# Patient Record
Sex: Female | Born: 1965 | Race: White | Hispanic: No
Health system: Southern US, Academic
[De-identification: ages and names within clinical notes are randomized; demographics above are authoritative.]

## PROBLEM LIST (undated history)

## (undated) DIAGNOSIS — E8881 Metabolic syndrome: Secondary | ICD-10-CM

## (undated) DIAGNOSIS — I671 Cerebral aneurysm, nonruptured: Secondary | ICD-10-CM

## (undated) DIAGNOSIS — E559 Vitamin D deficiency, unspecified: Secondary | ICD-10-CM

## (undated) DIAGNOSIS — K219 Gastro-esophageal reflux disease without esophagitis: Secondary | ICD-10-CM

## (undated) DIAGNOSIS — E785 Hyperlipidemia, unspecified: Secondary | ICD-10-CM

## (undated) HISTORY — DX: Hyperlipidemia, unspecified: E78.5

## (undated) HISTORY — DX: Metabolic syndrome: E88.810

## (undated) HISTORY — DX: Metabolic syndrome: E88.81

## (undated) HISTORY — DX: Cerebral aneurysm, nonruptured: I67.1

## (undated) HISTORY — DX: Gastro-esophageal reflux disease without esophagitis: K21.9

## (undated) HISTORY — DX: Vitamin D deficiency, unspecified: E55.9

---

## 1992-07-04 ENCOUNTER — Other Ambulatory Visit (HOSPITAL_COMMUNITY): Payer: Self-pay

## 2019-07-21 IMAGING — MG 3D SCREENING MAMMO BIL W/CAD
5 series · 8 of 24 positions shown · non-contrast
Comparison: 10/29/2016 and 01/10/2015.

------------- REPORT [id number] -------------
[facility]
[address]
Daina Ms.HASUDA, KYOUJI:
We wish to report the following on your recent mammography examination. We are sending a report to your referring physician or other health care provider. 
(       Normal/Negative:
No evidence of cancer.
This statement is mandated by the [place], Department of Health.
Your examination was performed by one of our technologists, who are registered radiological technologists and also specially certified in mammography:
___
Parlak, Edaly (M)
Nepomuceno, Martinez (M)

Your mammogram was interpreted by our radiologist.
( 
Sofeine Made, M.D.
(Annual Breast Examination by a physician or other health care provider
(Annual Mammography Screening beginning at age 40
(Monthly Breast Self Examination
------------- REPORT [id number] -------------
EXAM:  3D BILATERAL ANNUAL SCREENING DIGITAL MAMMOGRAM WITH TOMOSYNTHESIS AND CAD
INDICATION: Screening.

[R CC · right · 0.10mm/px · 2 of 2 slices shown]
[im 1/2]
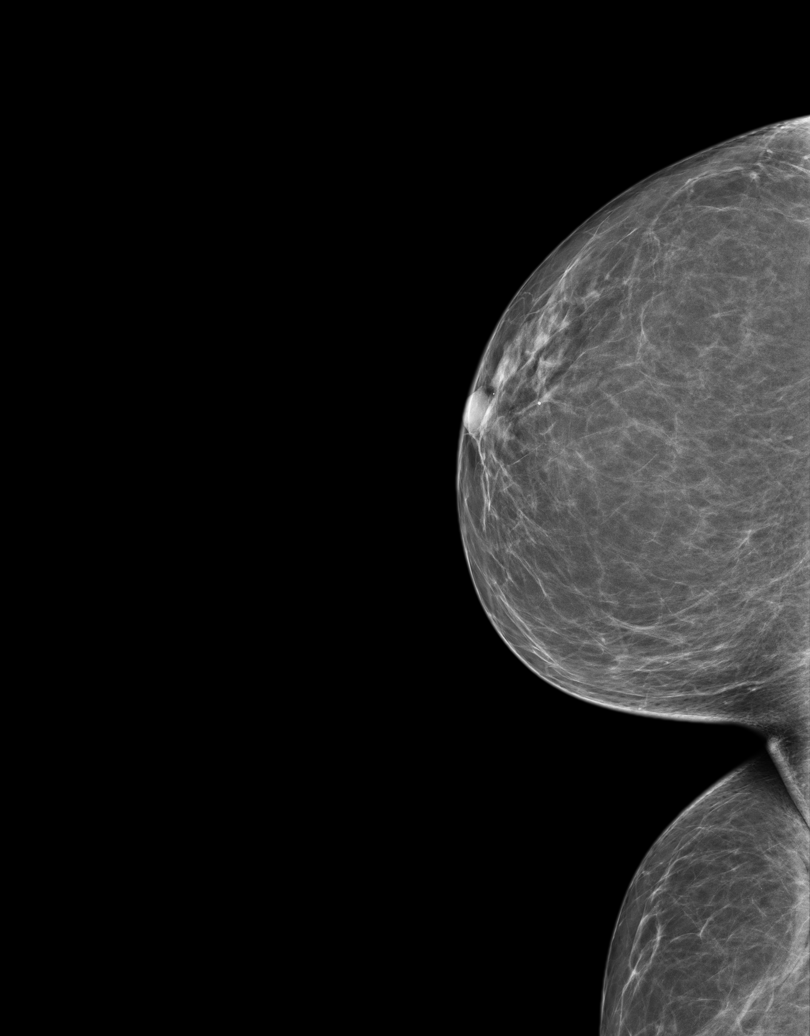
[im 2/2]
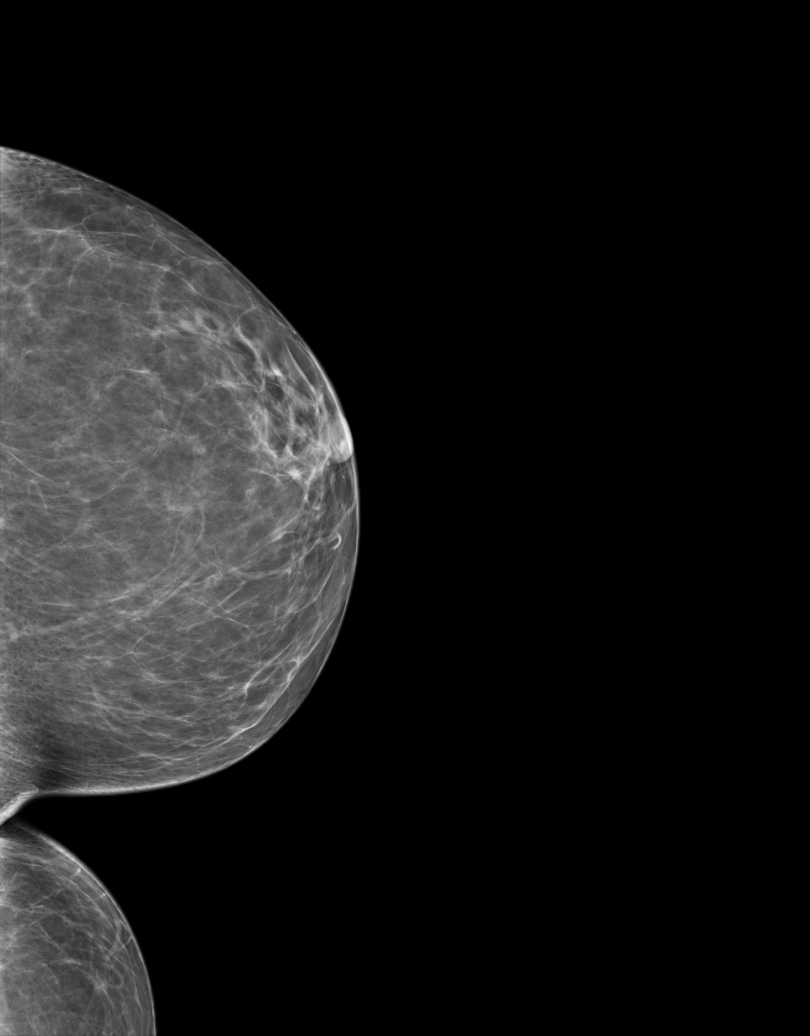

[3D SCREENING MAMMO BIL W/CAD · 2 acquisitions, 3 frames shown (1 of 2)]
[im 1/2]
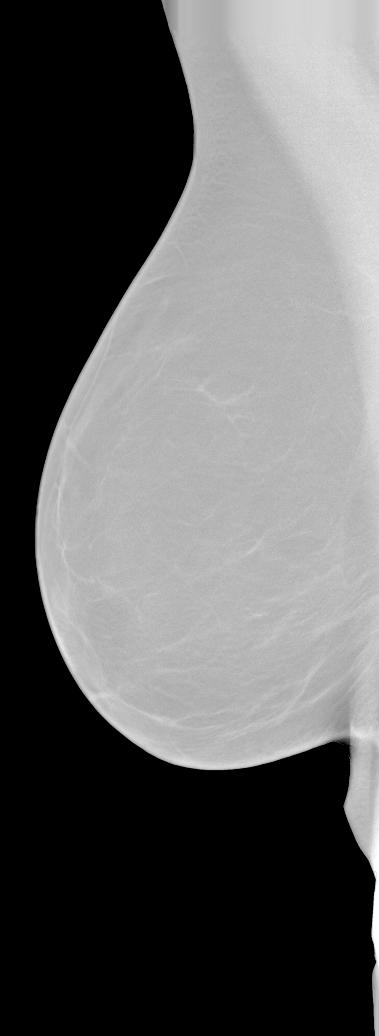
[im 2/2]
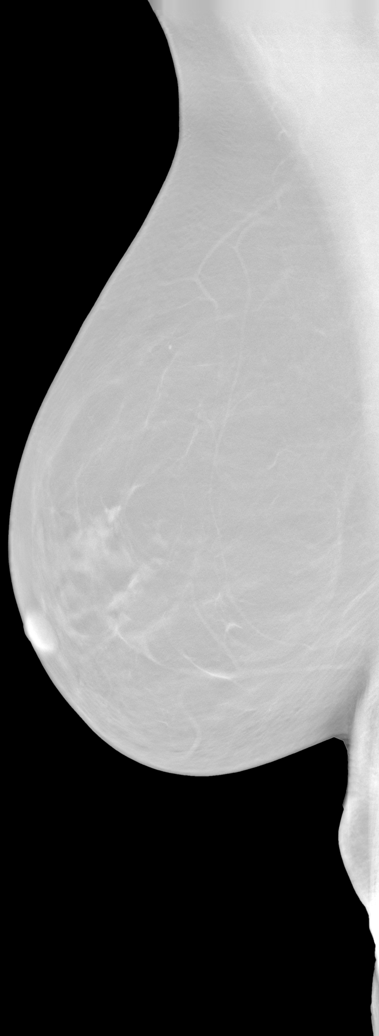
[im 2/2]
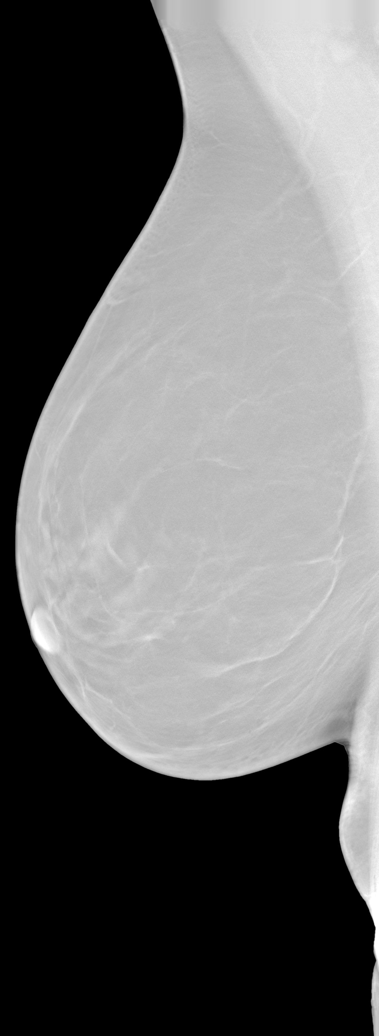

[R]
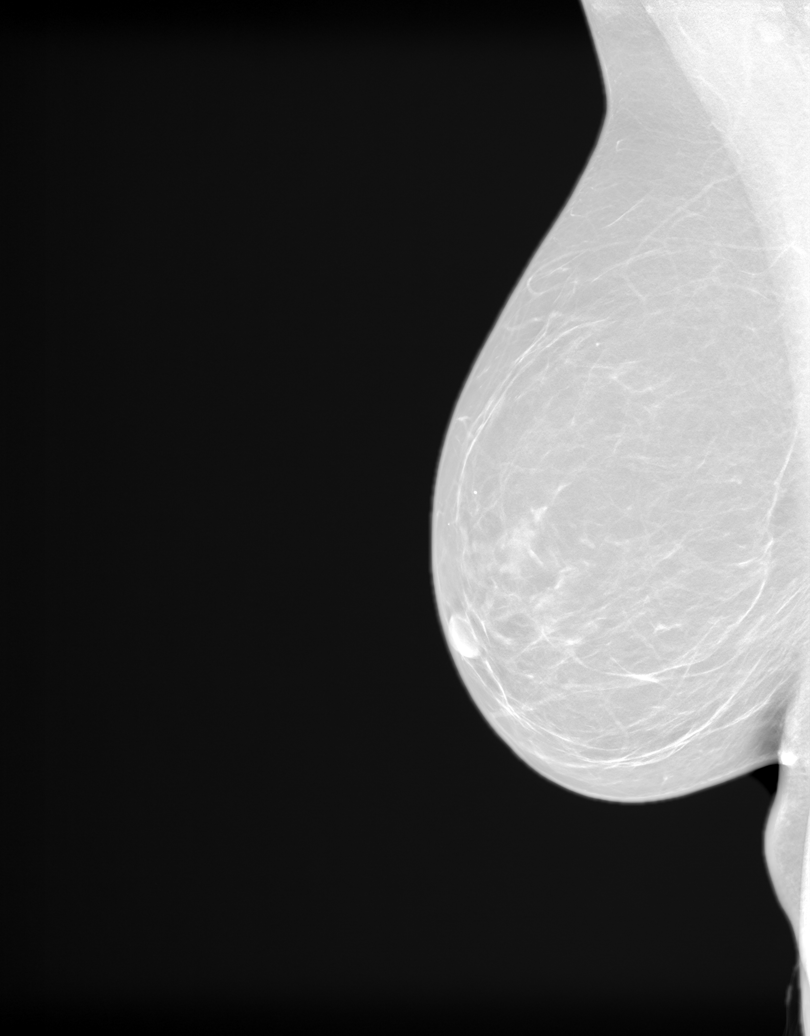

[3D SCREENING MAMMO BIL W/CAD (2 of 2) · tomo slice 11/69.0]
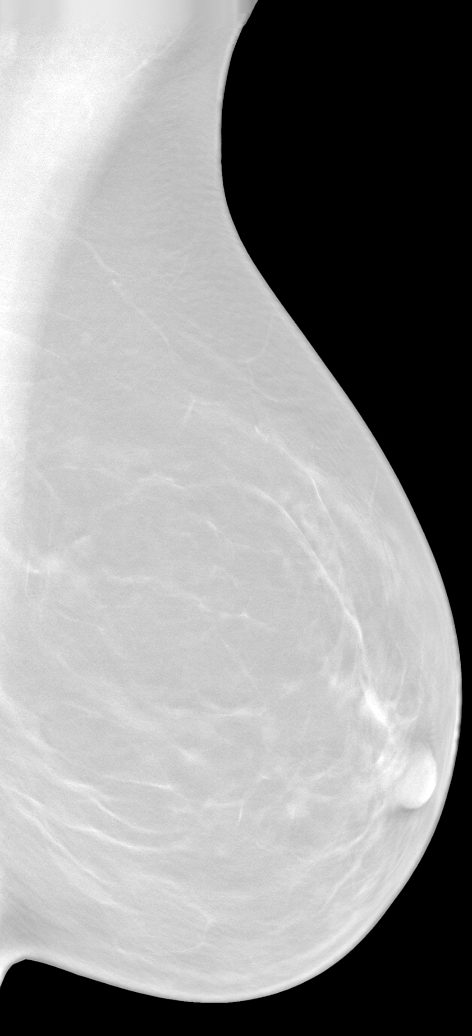

[L]
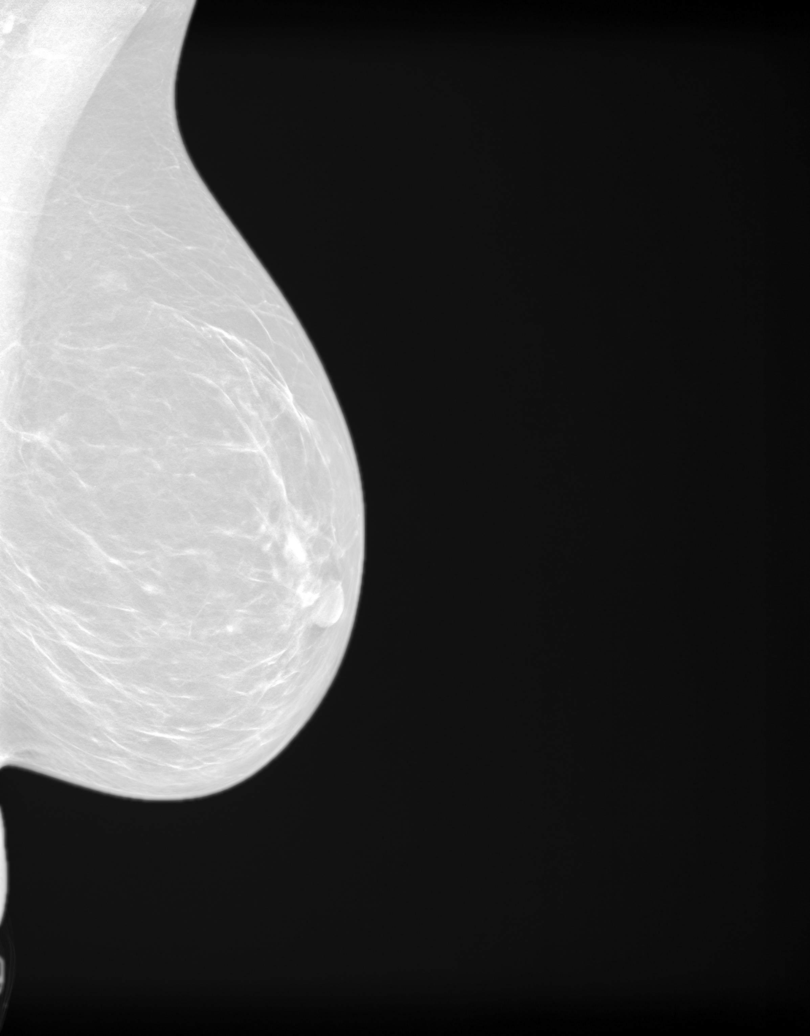

[8 of 24 positions shown; findings below may reference images not displayed]

FINDINGS: There are scattered fibroglandular elements.  There is no mass or suspicious cluster of microcalcifications.   There is no architectural distortion, skin thickening or nipple retraction.
IMPRESSION: 1.  BIRADS 2-Benign findings. Patient has been added in a reminder system with a target date for the next screening mammography.

2.  DENSITY CODE –  B (Scattered areas of fibroglandular density).

Final Assessment Code:

Bi-Rads 2 

BI-RADS 0
Need additional imaging evaluation

BI-RADS 1
Negative mammogram

BI-RADS 2
Benign finding

BI-RADS 3
Probably benign finding; short-interval follow-up suggested

BI-RADS 4
Suspicious abnormality; biopsy should be considered

BI-RADS 5
Highly suggestive of malignancy; appropriate action should be taken

BI-RADS 6
Known biopsy-proven malignancy; appropriate action should be taken

NOTE:
In compliance with Federal regulations, the results of this mammogram are being sent to the patient.

## 2020-08-17 IMAGING — MG 3D SCREENING MAMMO BIL W/CAD & TOMO
5 series · 7 of 24 positions shown · non-contrast
Comparison: 11/14/2020

------------- REPORT [id number] -------------
[facility] [place]
[address]
Tiger Ms.FORDHAM, GIORGIO:
We wish to report the following on your recent mammography examination. We are sending a report to your referring physician or other health care provider. 
(       Normal/Negative:
No evidence of cancer.
This statement is mandated by the [place], Department of Health.
Your examination was performed by one of our technologists, who are registered radiological technologists and also specially certified in mammography:
___
Markland, Marjuan (M)

Your mammogram was interpreted by our radiologist.
( 
Collette Sedman, M.D.
(Annual Breast Examination by a physician or other health care provider
(Annual Mammography Screening beginning at age 40
(Monthly Breast Self Examination
------------- REPORT [id number] -------------
﻿
                         #:
EXAM:  3D BILATERAL ANNUAL SCREENING DIGITAL MAMMOGRAM WITH CAD AND TOMOSYNTHESIS
INDICATION: Screening.

[R]
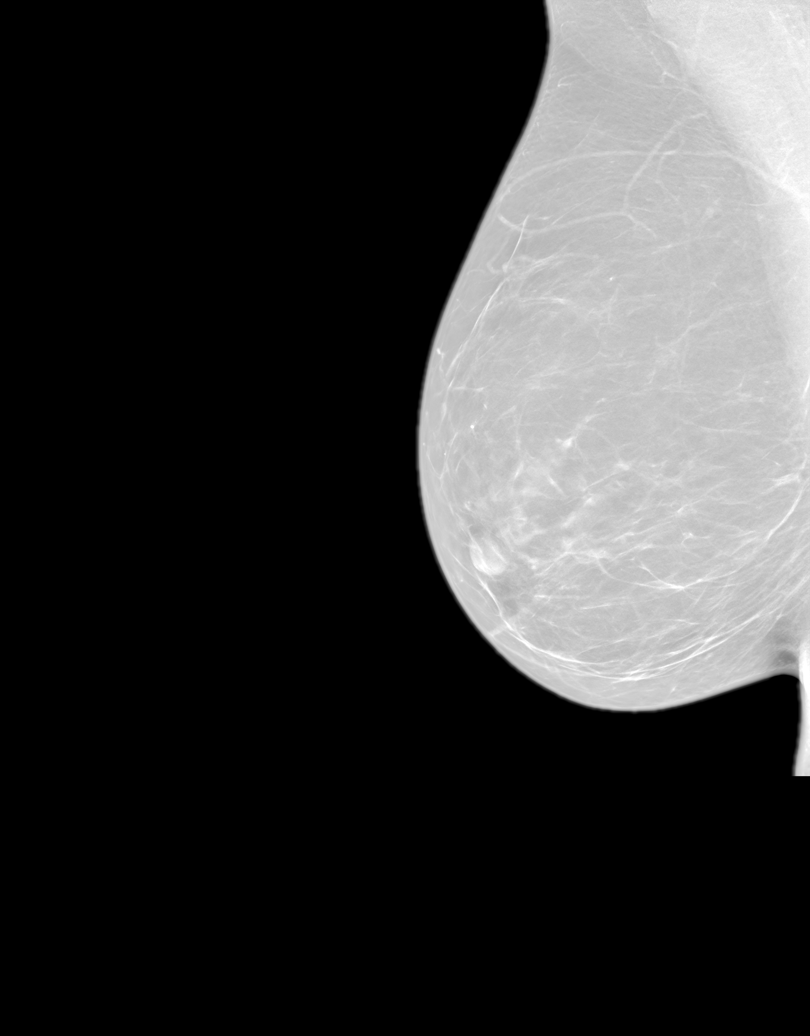

[L]
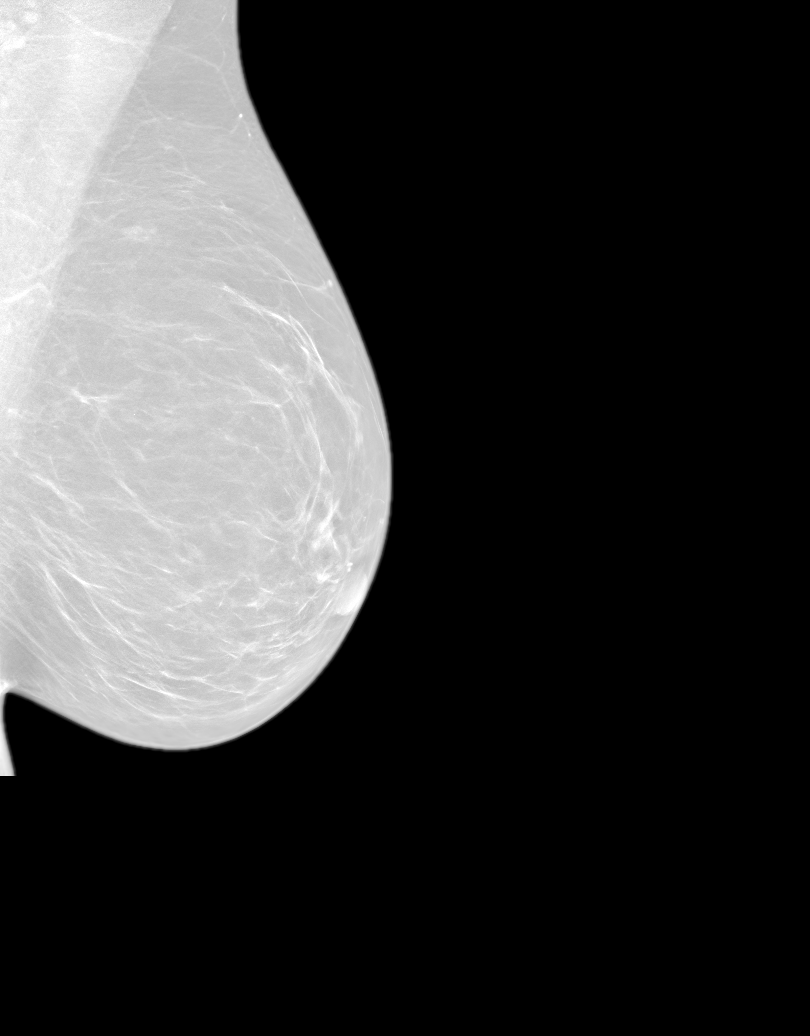

[Series 2749: R CC tomo · right · 2 of 3 slices shown]
[im 1/3]
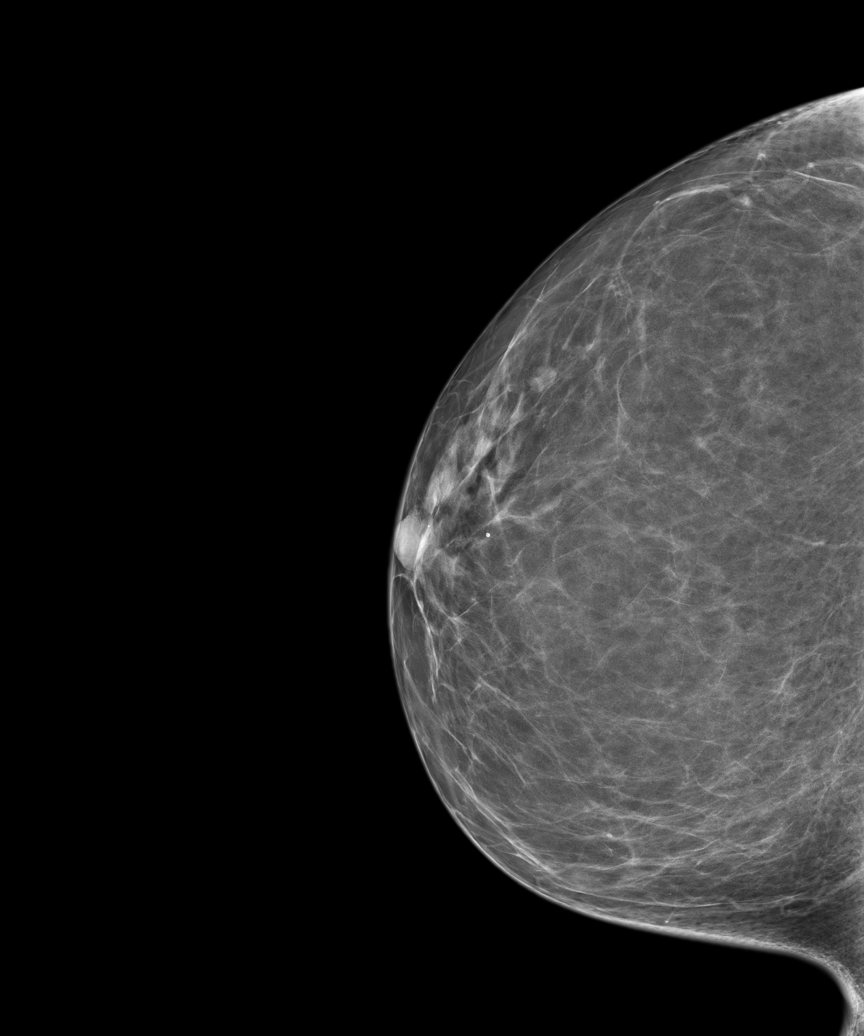
[im 3/3]
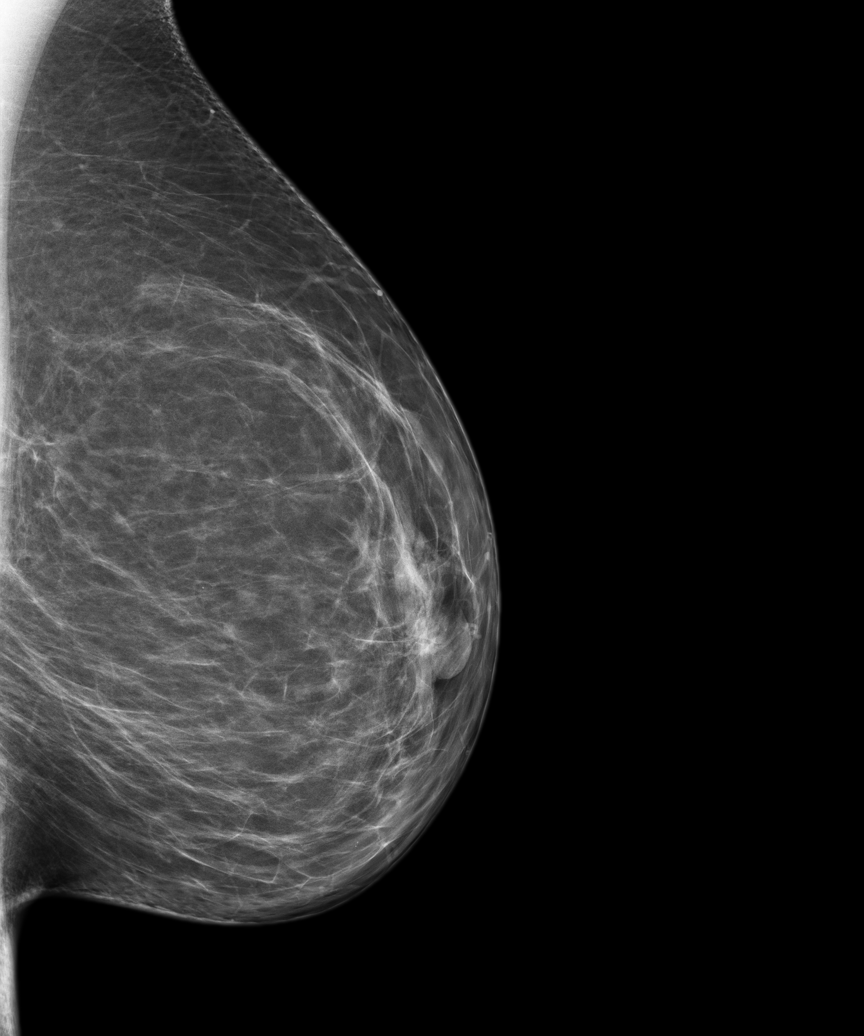

[Series 2751: 3D SCREENING MAMMO BIL W/CAD & TOMO tomo · 2 acquisitions, 2 frames shown (1 of 2)]
[im 1/2]
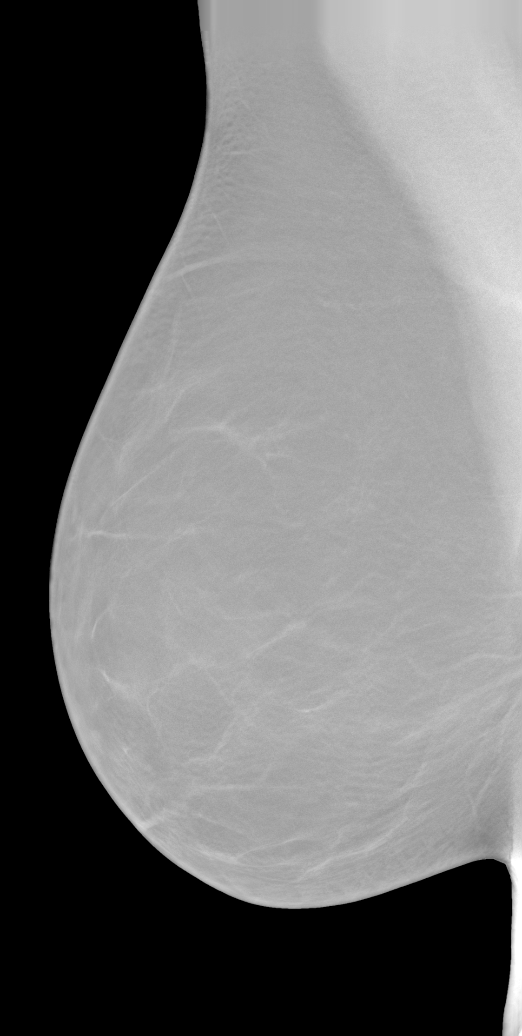
[im 2/2]
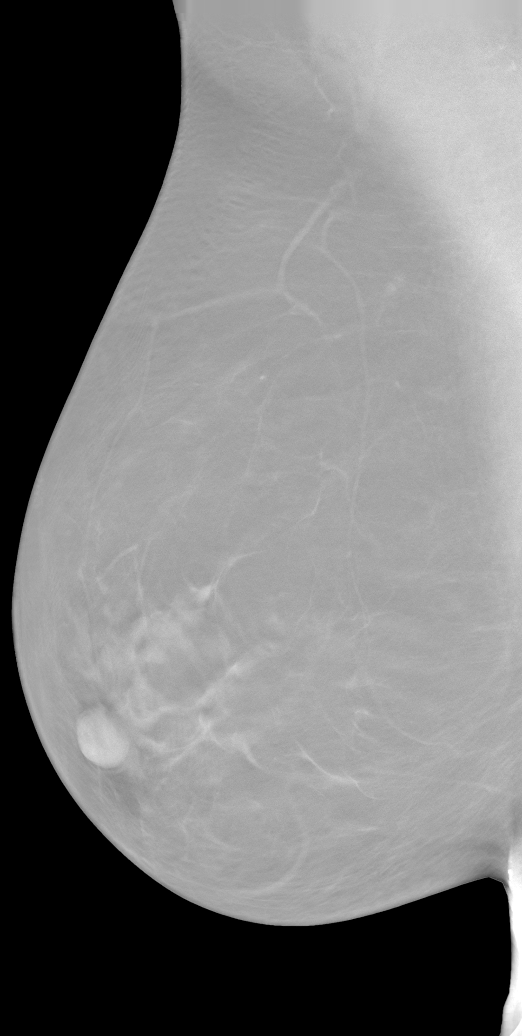

[3D SCREENING MAMMO BIL W/CAD & TOMO tomo (2 of 2) · tomo slice 13/79.0]
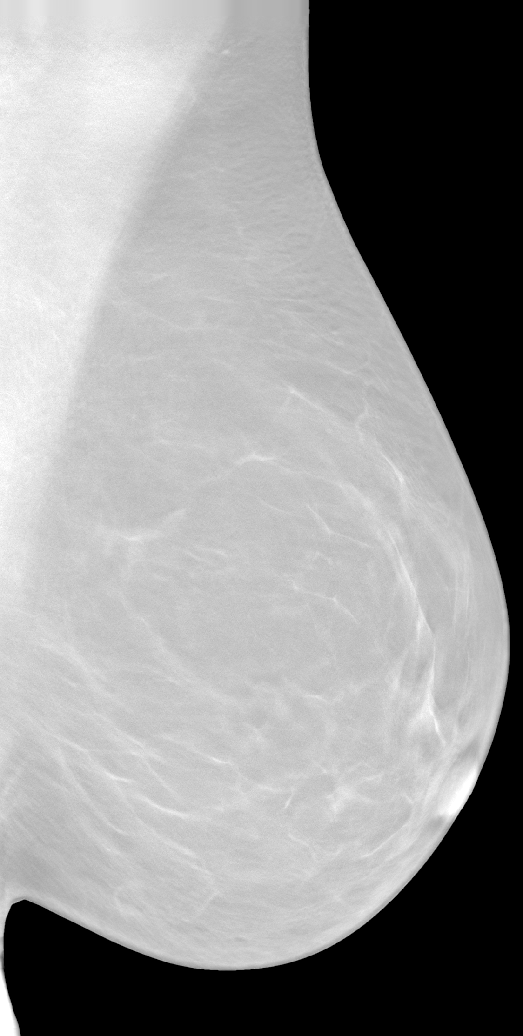

[7 of 24 positions shown; findings below may reference images not displayed]

FINDINGS: There are scattered fibroglandular elements.  There is no mass or suspicious cluster of microcalcifications.   There is no architectural distortion, skin thickening or nipple retraction.
IMPRESSION: 1.  BIRADS 2-Benign findings. Patient has been added in a reminder system with a target date for the next screening mammography.

2.  DENSITY CODE – B (Scattered areas of fibroglandular density). 

Final Assessment Code:

Bi-Rads 2 

BI-RADS 0
Need additional imaging evaluation.

BI-RADS 1
Negative mammogram.

BI-RADS 2
Benign finding.

BI-RADS 3
Probably benign finding; short-interval follow-up suggested.

BI-RADS 4
Suspicious abnormality; biopsy should be considered.

BI-RADS 5
Highly suggestive of malignancy; appropriate action should be taken.

BI-RADS 6
Known biopsy-proven malignancy; appropriate action should be taken.

NOTE:
In compliance with Federal regulations, the results of this mammogram are being sent to the patient.

## 2021-11-02 ENCOUNTER — Encounter (INDEPENDENT_AMBULATORY_CARE_PROVIDER_SITE_OTHER): Payer: Self-pay | Admitting: Surgery

## 2021-11-02 NOTE — Nursing Note (Signed)
Spoke with Chyrl Civatte at referring providers office and made them aware of pt appointment with Dr. Abbe Amsterdam.

## 2021-11-08 ENCOUNTER — Encounter (INDEPENDENT_AMBULATORY_CARE_PROVIDER_SITE_OTHER): Payer: Self-pay | Admitting: Surgery

## 2021-11-15 ENCOUNTER — Ambulatory Visit (INDEPENDENT_AMBULATORY_CARE_PROVIDER_SITE_OTHER): Payer: Self-pay | Admitting: Surgery

## 2021-11-20 IMAGING — MG 3D SCREENING MAMMO BIL AND TOMO
5 series · 7 of 24 positions shown · non-contrast
Comparison: 08/28/2021 and 07/31/2020.

------------- REPORT [id number] -------------
[facility]
[address]
Tiger Ms.FORDHAM, GIORGIO:
We wish to report the following on your recent mammography examination. We are sending a report to your referring physician or other health care provider. 
(       Normal/Negative:
No evidence of cancer.
This statement is mandated by the [place], Department of Health.
Your examination was performed by one of our technologists, who are registered radiological technologists and also specially certified in mammography:
___
Markland, Marjuan (M)

Your mammogram was interpreted by our radiologist.
( 
Collette Sedman, M.D.
(Annual Breast Examination by a physician or other health care provider
(Annual Mammography Screening beginning at age 40
(Monthly Breast Self Examination
------------- REPORT [id number] -------------
﻿
EXAM:  3D BILATERAL ANNUAL SCREENING DIGITAL MAMMOGRAM WITH TOMOSYNTHESIS
INDICATION: Screening.

[R]
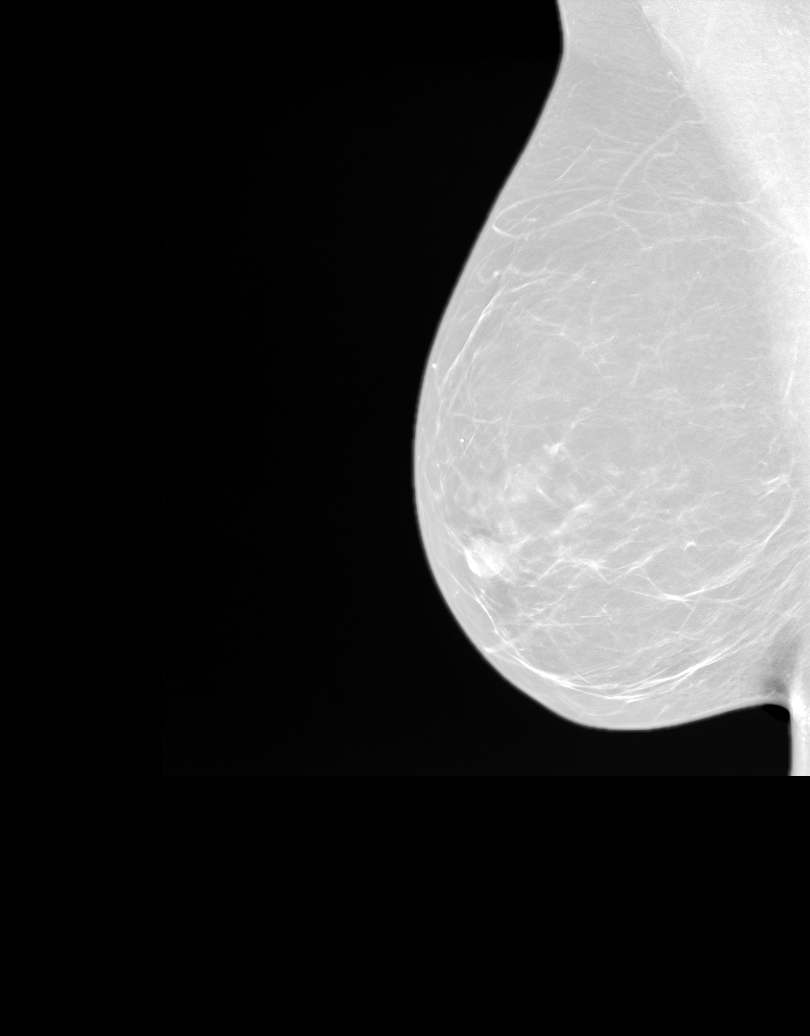

[L]
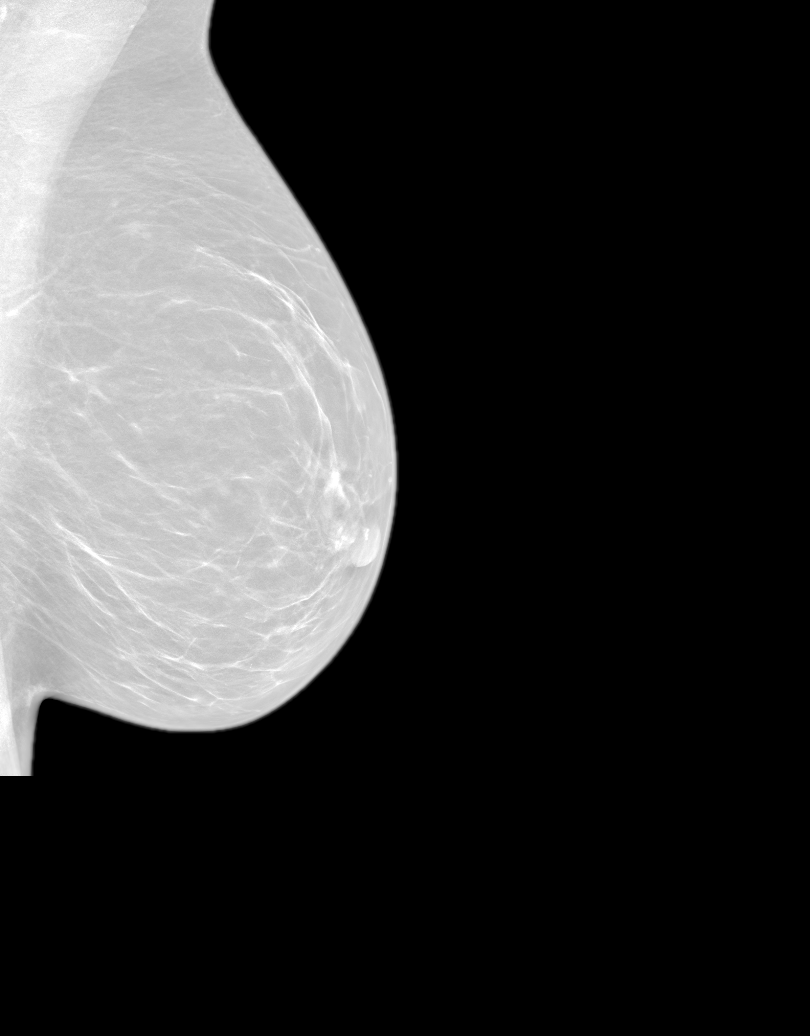

[R CC tomo · right · 0.10mm/px · 2 of 2 slices shown]
[im 1/2]
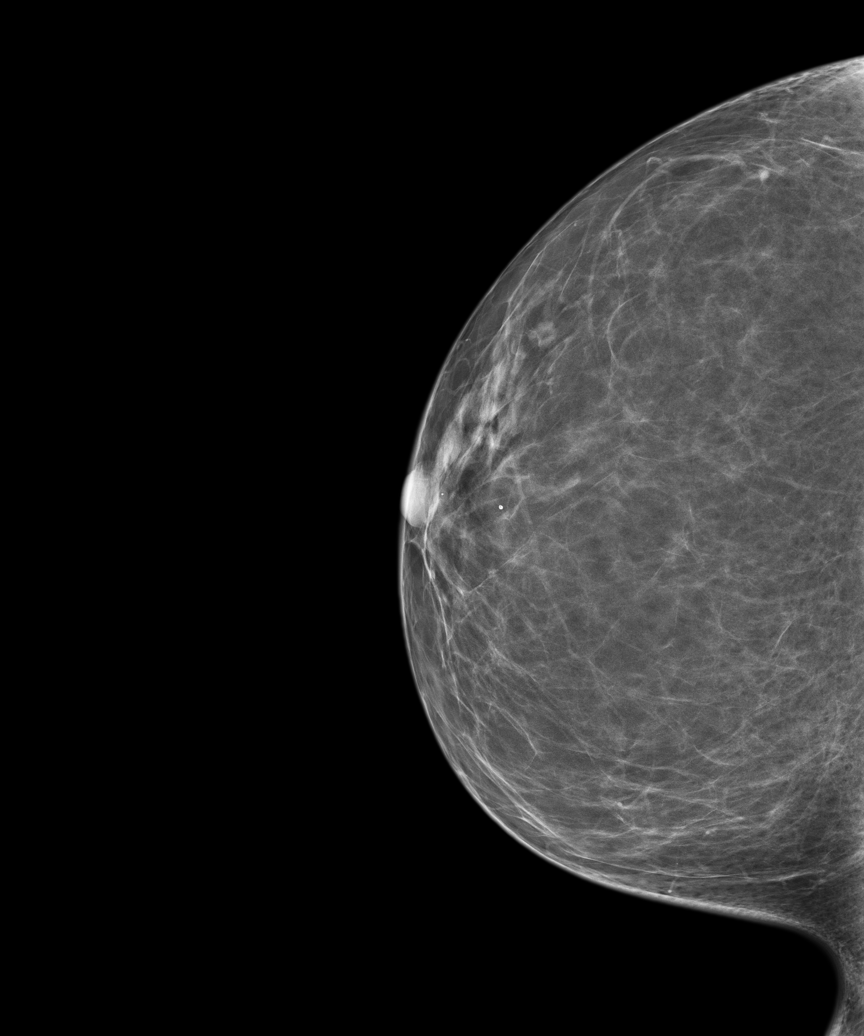
[im 2/2]
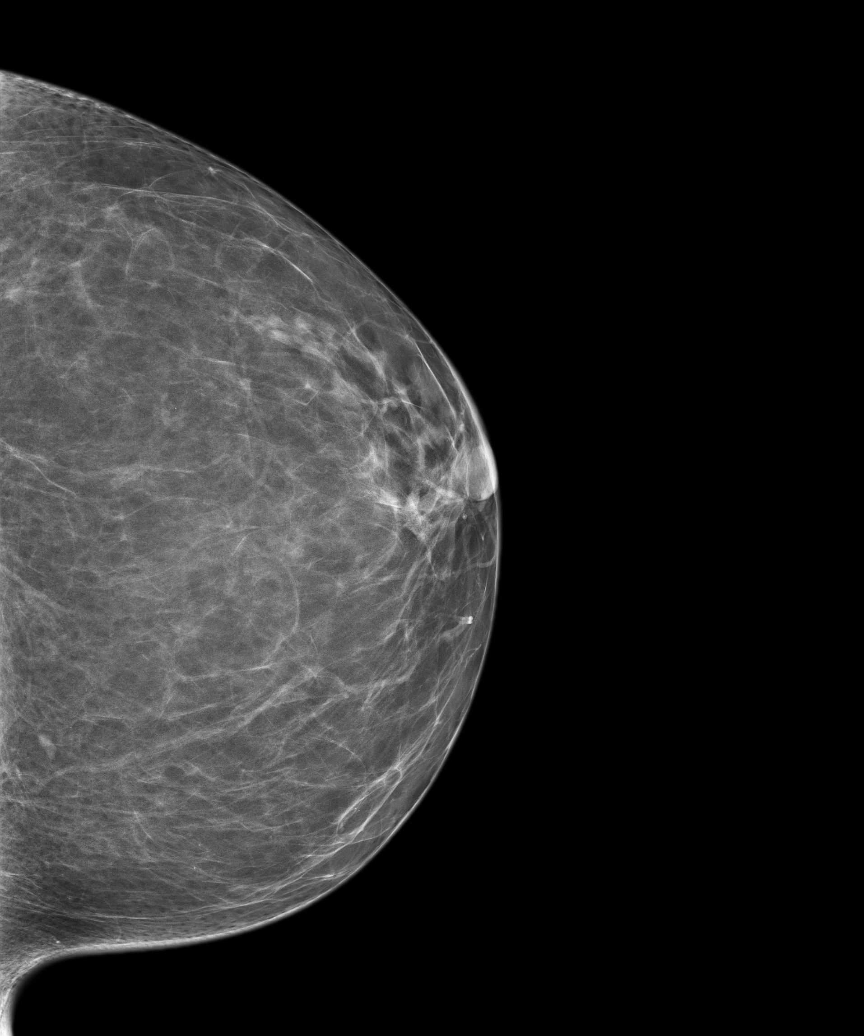

[3D SCREENING MAMMO BIL AND TOMO tomo · 2 acquisitions, 2 frames shown (1 of 2)]
[im 1/2]
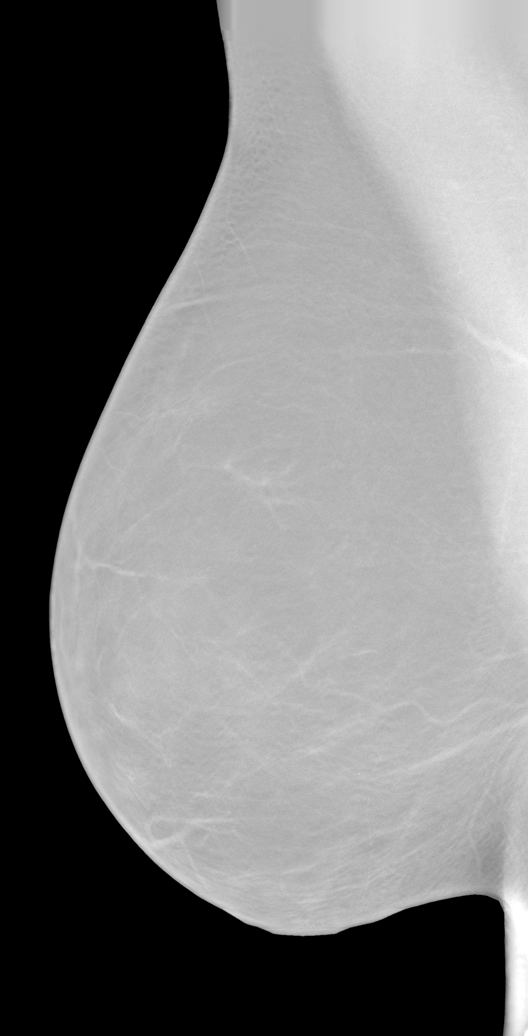
[im 2/2]
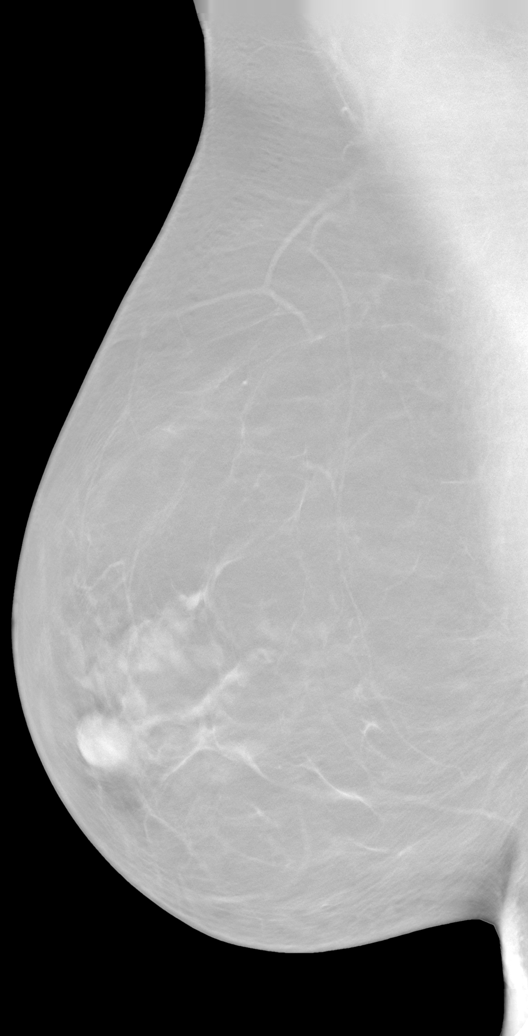

[3D SCREENING MAMMO BIL AND TOMO tomo (2 of 2) · tomo slice 12/74.0]
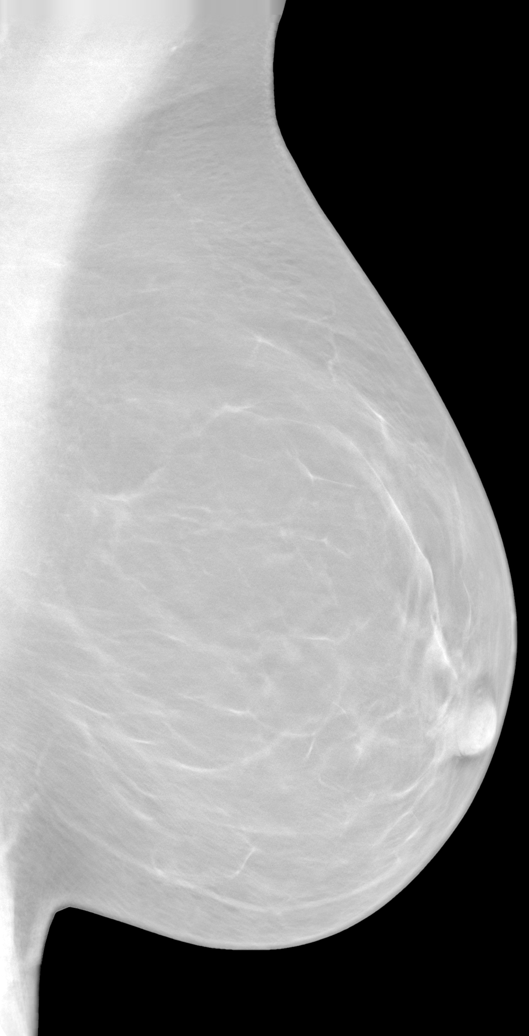

[7 of 24 positions shown; findings below may reference images not displayed]

FINDINGS: There are scattered fibroglandular elements.  There is no mass or suspicious cluster of microcalcifications.   There is no architectural distortion, skin thickening or nipple retraction.
IMPRESSION: 1.  BIRADS 2-Benign findings. Patient has been added in a reminder system with a target date for the next screening mammography.

2.  DENSITY CODE – B (Scattered areas of fibroglandular density). 

Final Assessment Code:

Bi-Rads 2 

BI-RADS 0
 Need additional imaging evaluation.

BI-RADS 1
 Negative mammogram.

BI-RADS 2
 Benign finding.

BI-RADS 3
 Probably benign finding; short-interval follow-up suggested.

BI-RADS 4
 Suspicious abnormality; biopsy should be considered.

BI-RADS 5
 Highly suggestive of malignancy; appropriate action should be taken.

BI-RADS 6
 Known biopsy-proven malignancy; appropriate action should be taken.

NOTE:
In compliance with Federal regulations, the results of this mammogram are being sent to the patient.

## 2021-11-22 ENCOUNTER — Ambulatory Visit (INDEPENDENT_AMBULATORY_CARE_PROVIDER_SITE_OTHER): Payer: No Typology Code available for payment source | Admitting: Surgery

## 2023-06-27 IMAGING — MR MRI HAND LT WO CONTRAST
4 of 8 series · 19 of 40 positions shown · IV contrast (gadolinium)
Comparison: Two views of left hand and thumb obtained today for comparison.

﻿EXAM:  36453   MRI HAND LT WO CONTRAST
INDICATION: 57-year-old sustained trauma on [DATE]th.  Persistent pain in the left thumb with a diagnosis of sprained radial collateral ligament of metacarpophalangeal joint.
TECHNIQUE: Multiplanar, multisequential MRI of the left hand including the thumb was performed without gadolinium contrast.

[Series 6: T1 · axial · left · 4.0mm · 0.31mm/px · z∈[-35,+81]mm · 3 of 38 slices shown]
[im 7/38]
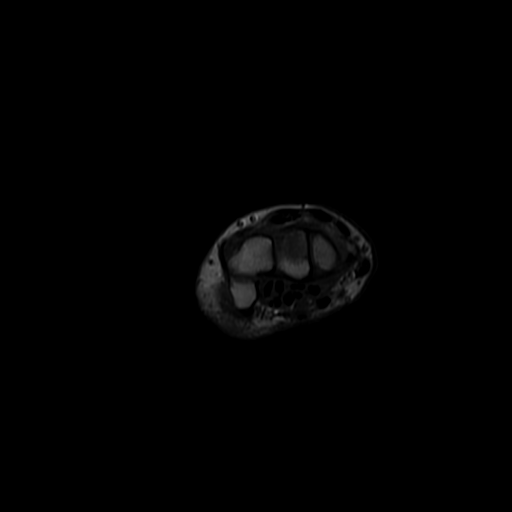
[im 19/38]
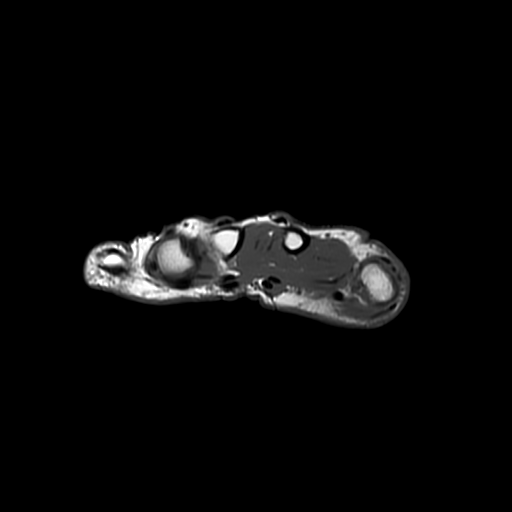
[im 31/38]
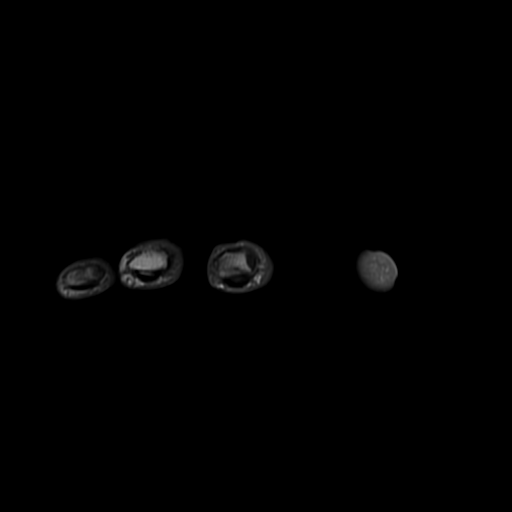

[Series 7: PD fat-sat · axial · left · 4.0mm · 0.35mm/px · z∈[-44,+134]mm · 8 of 38 slices shown (1 of 2)]
[im 1/38]
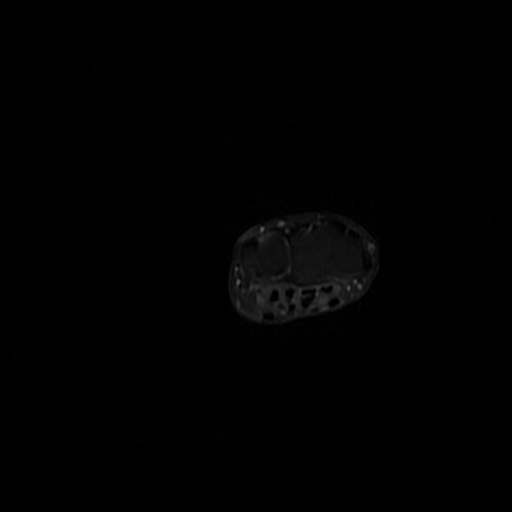
[im 6/38]
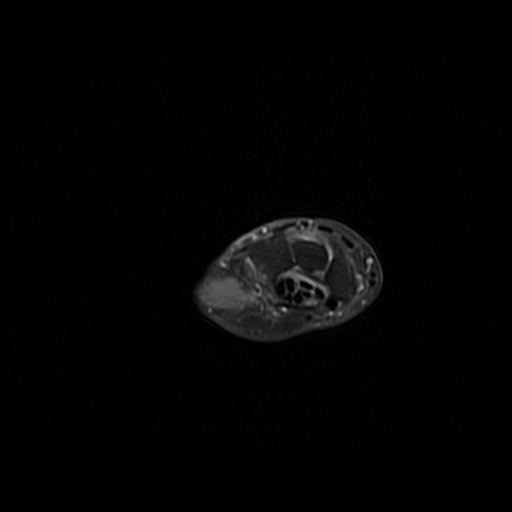
[im 11/38]
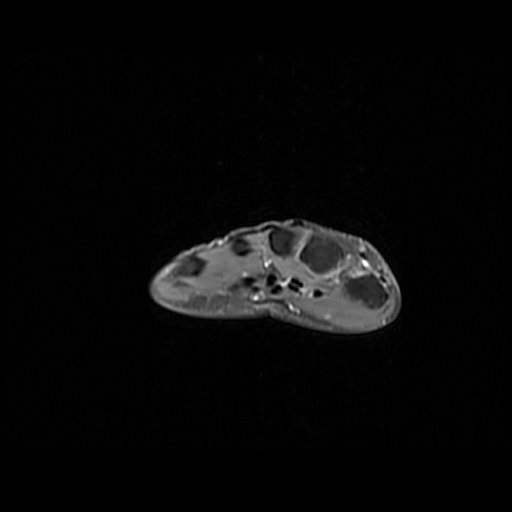
[im 16/38]
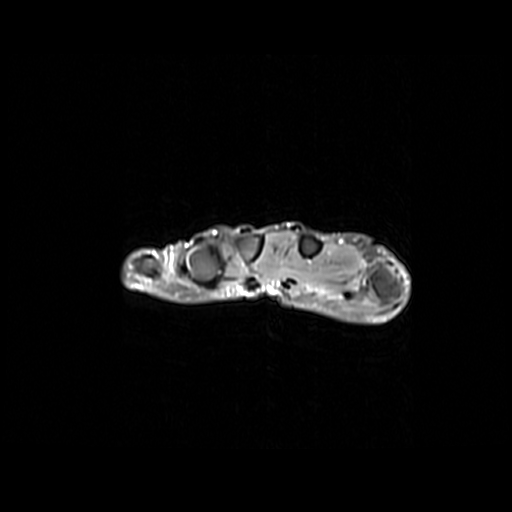
[im 22/38]
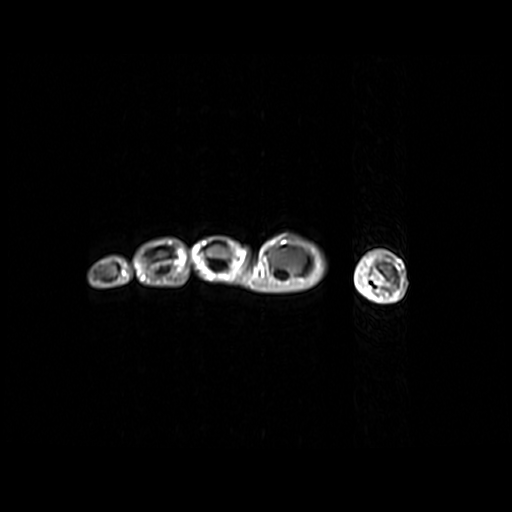
[im 27/38]
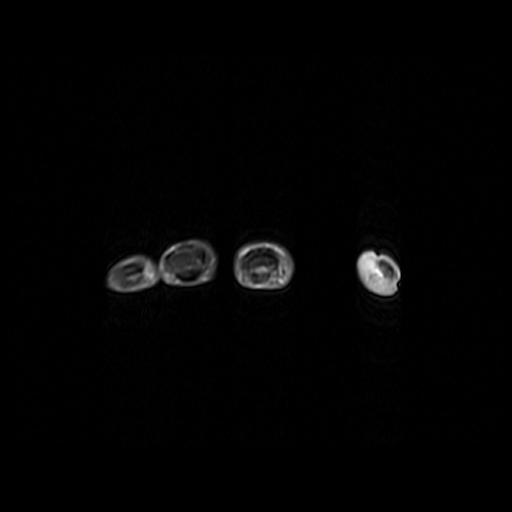
[im 32/38]
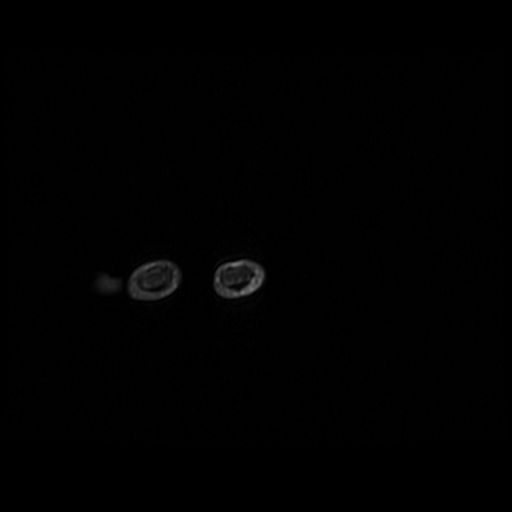
[im 38/38]
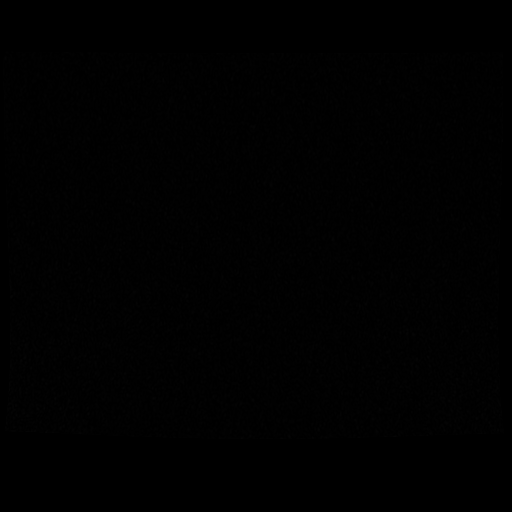

[Series 9: PD fat-sat · coronal · left · 2.5mm · 0.43mm/px · 4 of 18 slices shown (2 of 2)]
[im 1/18]
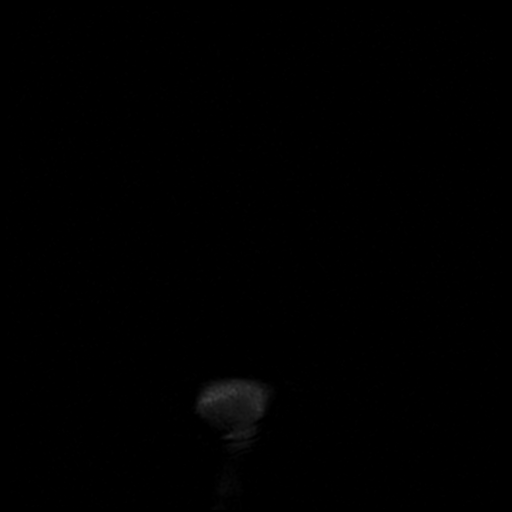
[im 6/18]
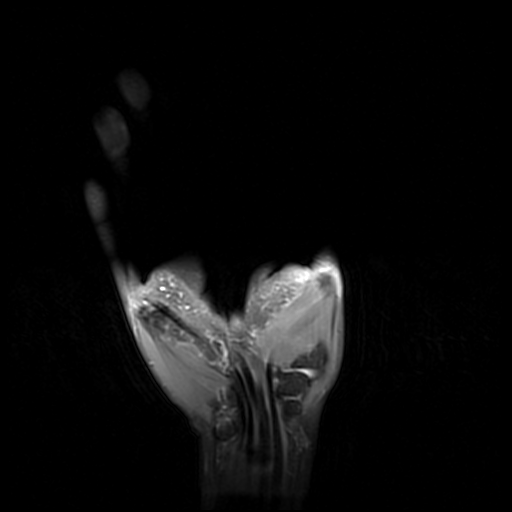
[im 12/18]
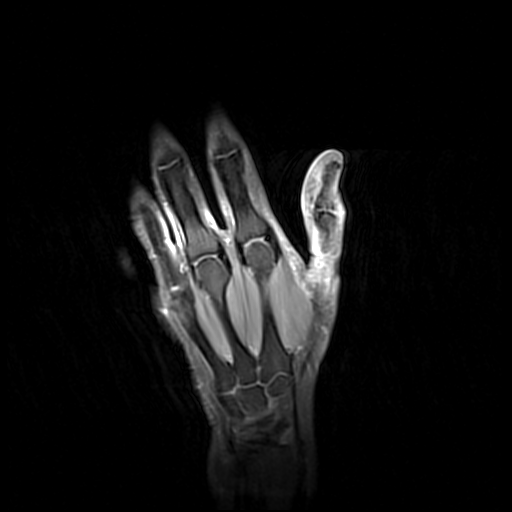
[im 18/18]
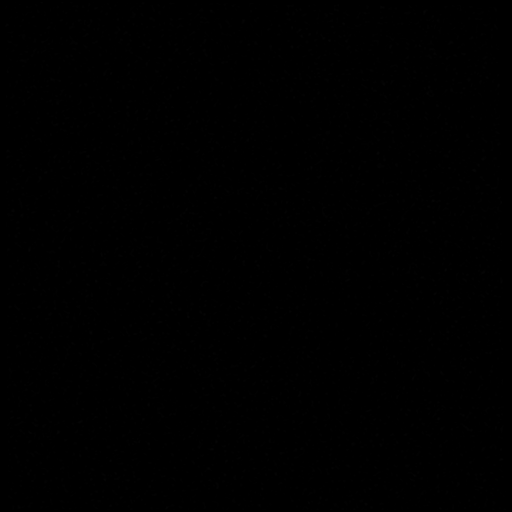

[Series 11: T2 fat-sat · sagittal · left · 3.0mm · 0.39mm/px · 4 of 30 slices shown]
[im 1/30]
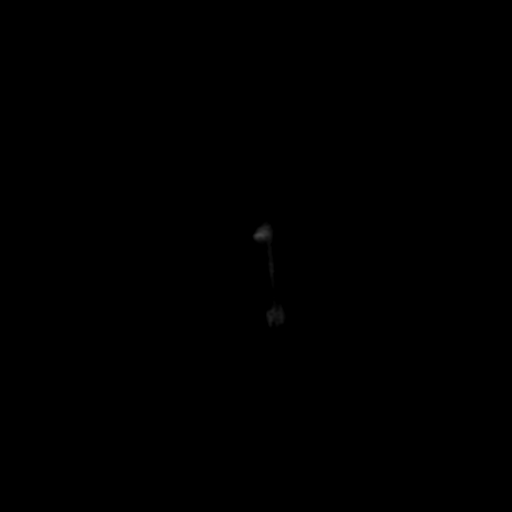
[im 6/30]
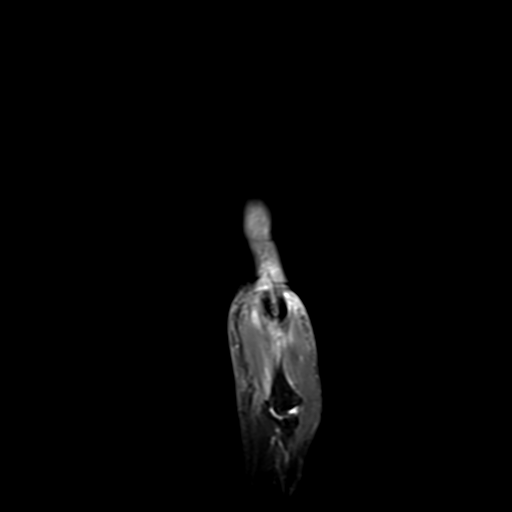
[im 18/30]
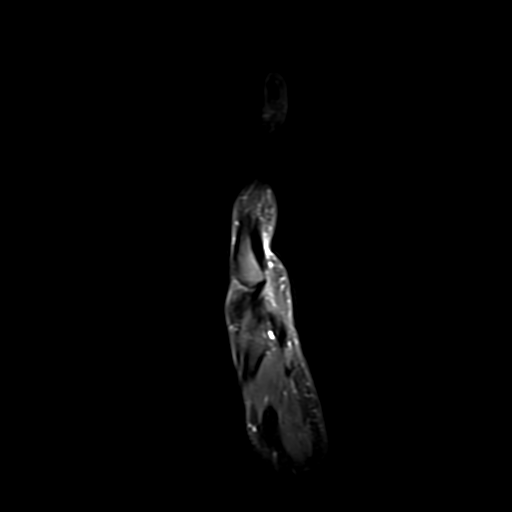
[im 30/30]
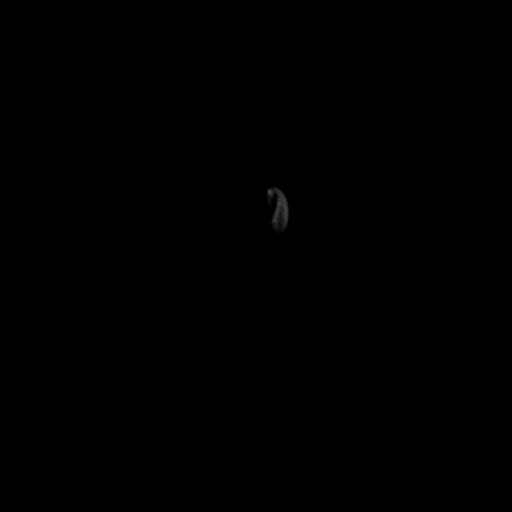

[19 of 40 positions shown; findings below may reference images not displayed]

FINDINGS: Bone marrow edema of the proximal and mid portion of the proximal phalanx of the thumb is noted.  No fracture line is seen.  Minimal bone marrow edema at the head of the 1st metacarpal bone is noted.  Interphalangeal joint of the left thumb is intact. 

Ulnar collateral ligament of the metacarpophalangeal joint of the thumb is intact.  Radial collateral ligament is not visualized clearly suggestive of sprained radial collateral ligament of the 1st metacarpophalangeal joint of the thumb.

The flexor and extensor tendons of the thumb are intact.  No acute findings at the wrist.
IMPRESSION: 1. No fracture line of the proximal phalanx of the thumb is seen. However, there is mild bone marrow edema of the proximal and midportion of the proximal phalanx of the thumb and minimal bone marrow edema at the head of the 1st metacarpal bone are noted.

2. The ulnar collateral ligament is intact. Radial collateral ligament is poorly visualized suggestive of sprained radial collateral ligament of the 1st metacarpophalangeal joint.  

Electronically Signed by FA, M MAHDI at [id number] [DATE]

## 2023-08-25 IMAGING — MG 3D SCREENING MAMMO BIL AND TOMO
3 series · 7 of 24 positions shown · non-contrast
Comparison: 11/22/2022, 08/19/2021.

------------- REPORT [id number] -------------
[facility]
[address]
[place]/MS/UNYIL, SUDARMONO
We wish to report the following on your recent mammography examination. We are sending a report to your referring physician or other health care provider.
INDICATION: Screening mammogram.  Asymptomatic 57-year-old with no family history.  Lifetime breast cancer risk 5.5%.

[R CC tomo · right · 0.10mm/px · 4 of 4 slices shown]
[im 1/4]
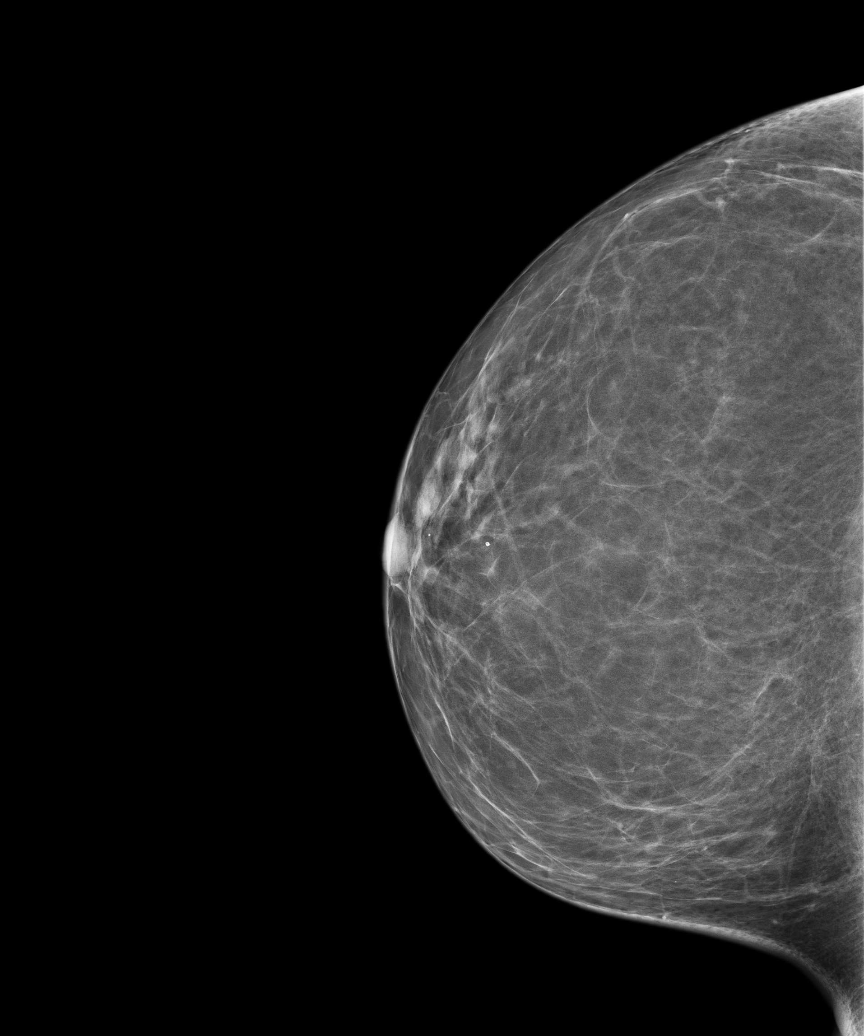
[im 2/4]
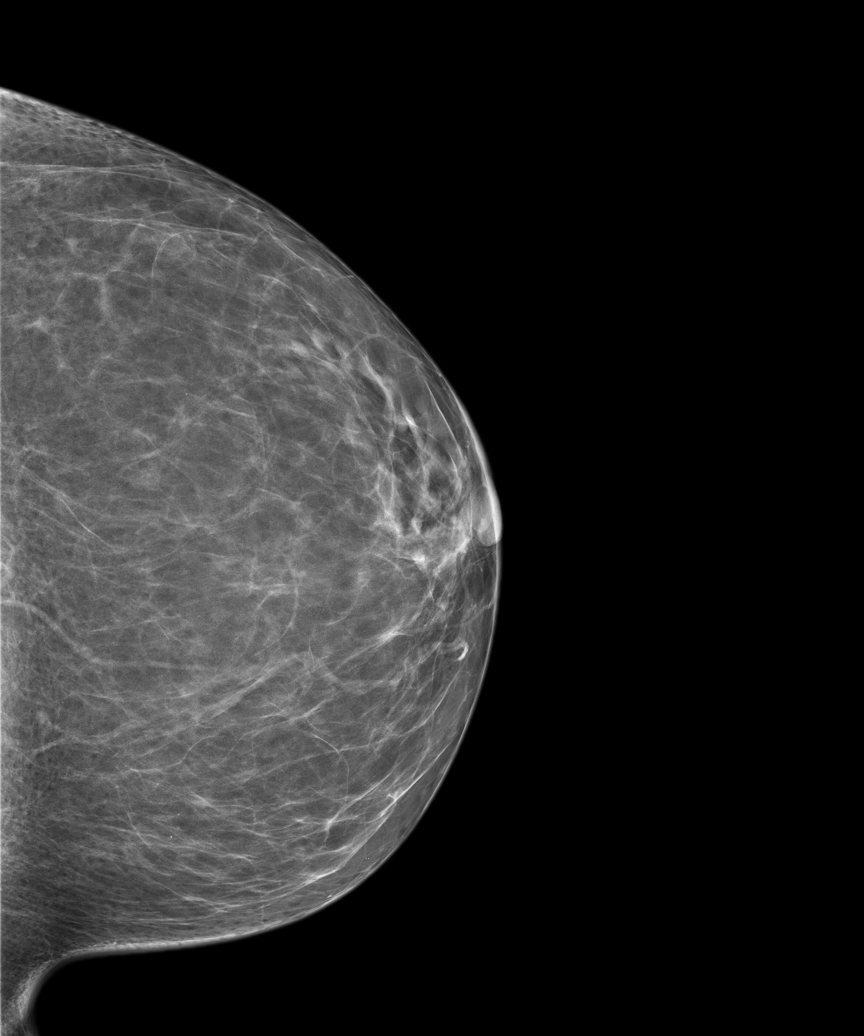
[im 3/4]
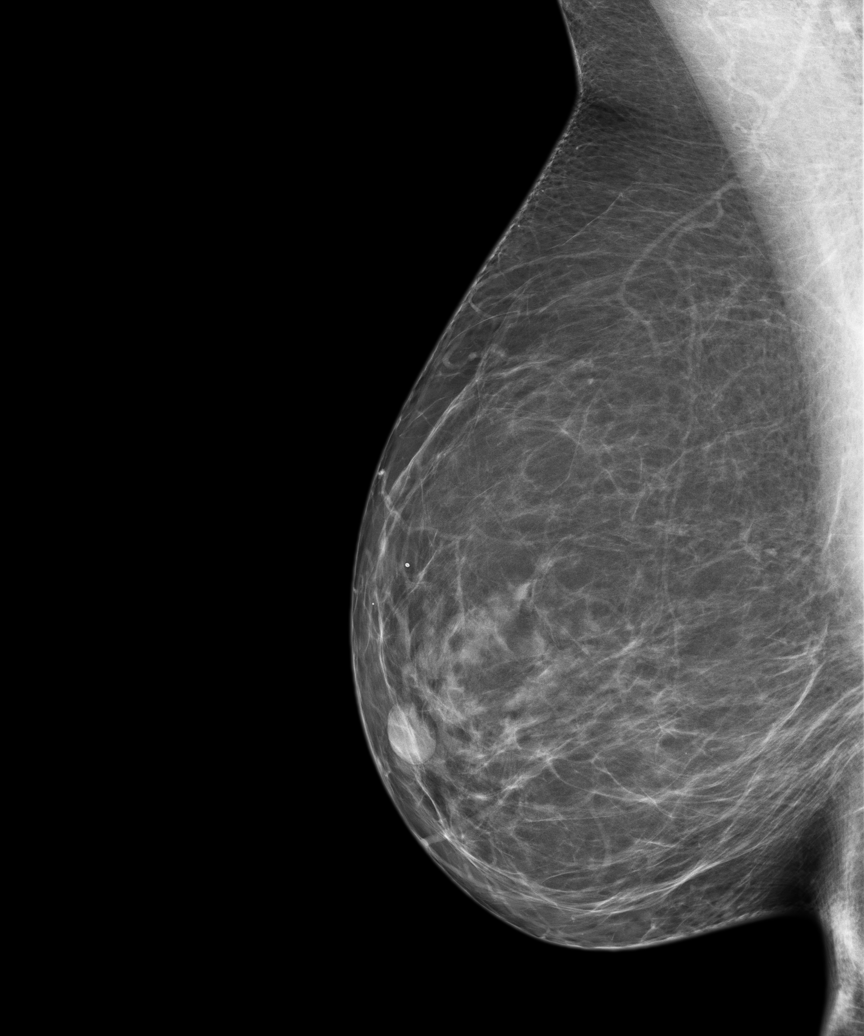
[im 4/4]
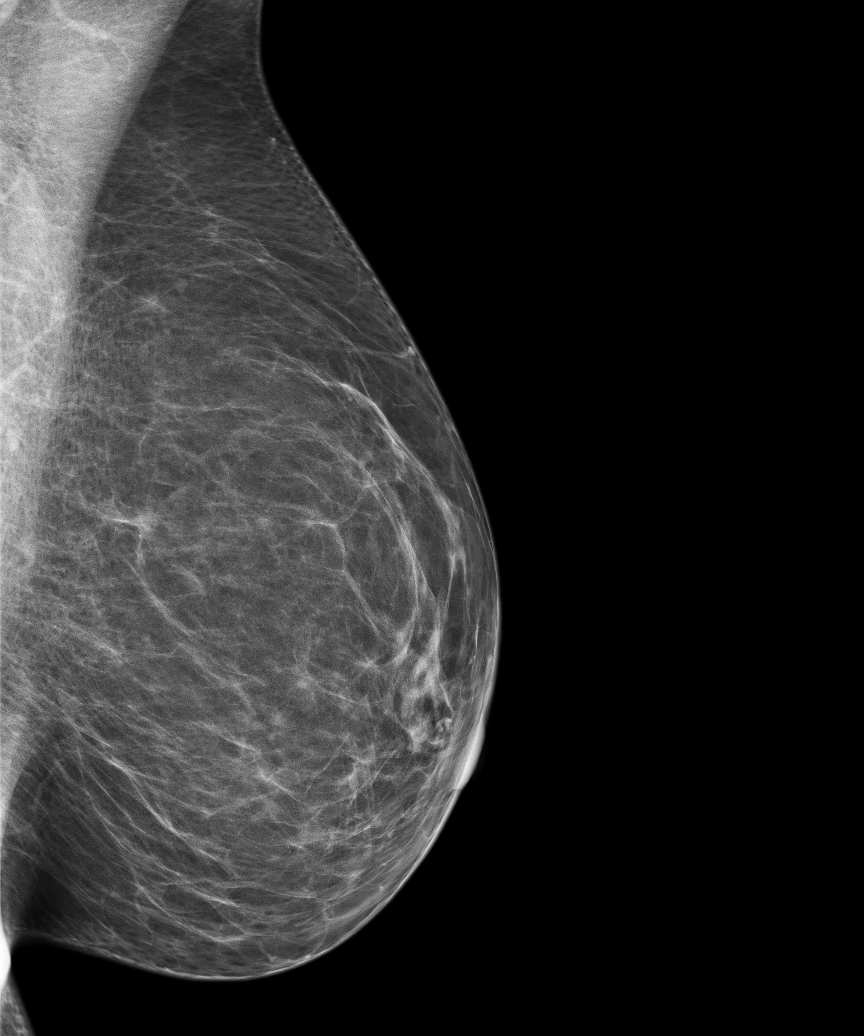

[3D SCREENING MAMMO BIL AND TOMO tomo · 2 acquisitions, 2 frames shown (1 of 2)]
[im 1/2]
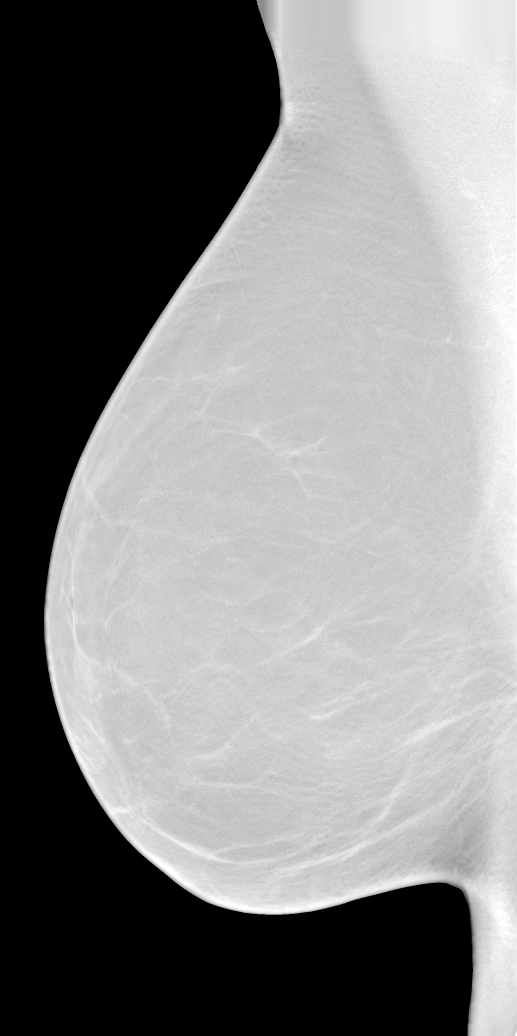
[im 2/2]
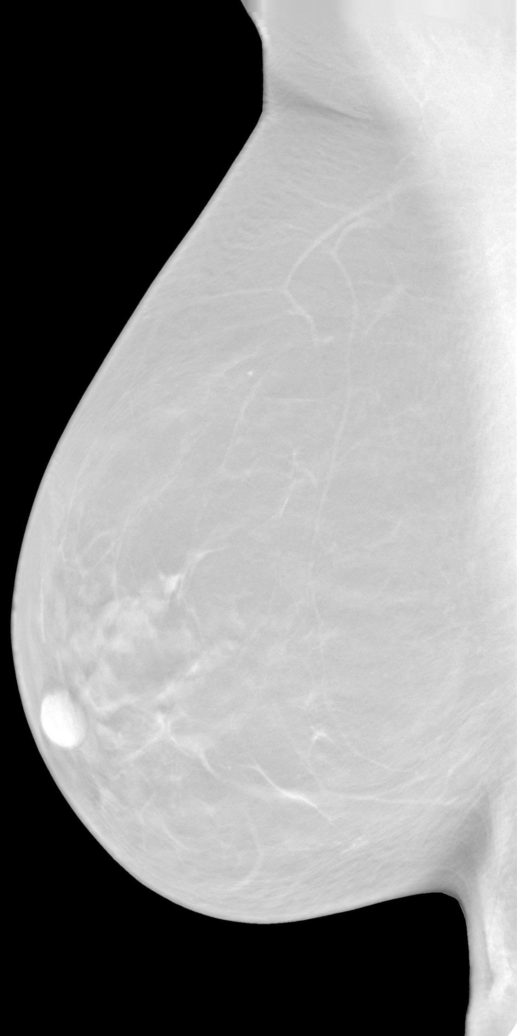

[3D SCREENING MAMMO BIL AND TOMO tomo (2 of 2) · tomo slice 11/71.0]
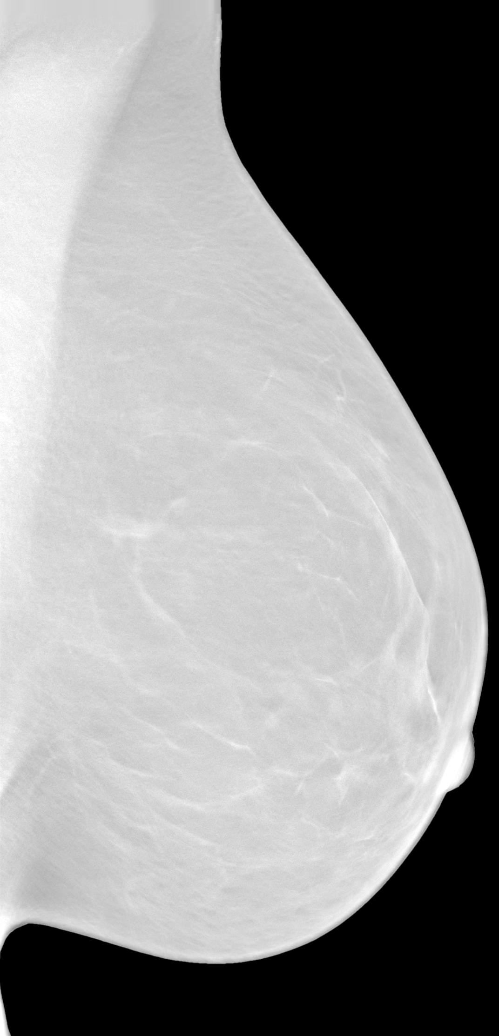

[7 of 24 positions shown; findings below may reference images not displayed]

FINDING: Normal-no evidence of cancer

This statement is mandated by the [place], Department of Health.
Your examination was performed by one of our technologists, who are registered radiological technologists and also specially certified in mammography:
___
Marzan, Heron (M)

Your mammogram was interpreted by our radiologist.

( 
Apple Martha, M.D.

(Annual Breast Examination by a physician or other health care provider
(Annual Mammography Screening beginning at age 40
(Monthly Breast Self Examination

------------- REPORT [id number] -------------
﻿

EXAM:  3D SCREENING MAMMO BIL AND TOMO
FINDINGS: There are scattered fibroglandular elements.  There is no mass or suspicious cluster of microcalcifications.   There is no architectural distortion, skin thickening or nipple retraction.
IMPRESSION: 1.  BIRADS 2-Benign findings. Patient has been added in a reminder system with a target date for the next screening mammography.

2.  DENSITY CODE –  B (Scattered areas of fibroglandular density) 

Final Assessment Code:

BI-RADS 0
 Need additional imaging evaluation.

BI-RADS 1
 Negative mammogram.

BI-RADS 2
 Benign finding.

BI-RADS 3
 Probably benign finding; short-interval follow-up suggested.

BI-RADS 4
 Suspicious abnormality; biopsy should be considered.

BI-RADS 5
 Highly suggestive of malignancy; appropriate action should be taken.

BI-RADS 6
 Known biopsy-proven malignancy; appropriate action should be taken.

NOTE:
In compliance with Federal regulations, the results of this mammogram are being sent to the patient.
